# Patient Record
Sex: Female | Born: 1995 | Hispanic: No | Marital: Single | Smoking: Never smoker
Health system: Southern US, Community
[De-identification: ages and names within clinical notes are randomized; demographics above are authoritative.]

## PROBLEM LIST (undated history)

## (undated) HISTORY — PX: TONSILLECTOMY: SUR1361

---
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
## 2017-07-23 ENCOUNTER — Emergency Department (HOSPITAL_COMMUNITY): Payer: PRIVATE HEALTH INSURANCE

## 2017-07-23 ENCOUNTER — Emergency Department (HOSPITAL_COMMUNITY)
Admission: EM | Admit: 2017-07-23 | Discharge: 2017-07-23 | Disposition: A | Discharge status: Home / Self Care | Payer: PRIVATE HEALTH INSURANCE | Attending: Emergency Medicine | Admitting: Emergency Medicine

## 2017-07-23 ENCOUNTER — Encounter (HOSPITAL_COMMUNITY): Payer: Self-pay | Admitting: Emergency Medicine

## 2017-07-23 DIAGNOSIS — W501XXA Accidental kick by another person, initial encounter: Secondary | ICD-10-CM | POA: Insufficient documentation

## 2017-07-23 DIAGNOSIS — Z79899 Other long term (current) drug therapy: Secondary | ICD-10-CM | POA: Insufficient documentation

## 2017-07-23 DIAGNOSIS — Y9366 Activity, soccer: Secondary | ICD-10-CM | POA: Insufficient documentation

## 2017-07-23 DIAGNOSIS — Y999 Unspecified external cause status: Secondary | ICD-10-CM | POA: Insufficient documentation

## 2017-07-23 DIAGNOSIS — S82891A Other fracture of right lower leg, initial encounter for closed fracture: Secondary | ICD-10-CM | POA: Insufficient documentation

## 2017-07-23 DIAGNOSIS — Y929 Unspecified place or not applicable: Secondary | ICD-10-CM | POA: Insufficient documentation

## 2017-07-23 MED ORDER — ACETAMINOPHEN 500 MG PO TABS
1000.0000 mg | ORAL_TABLET | Freq: Once | ORAL | Status: AC
  Administered 2017-07-23: 1000 mg via ORAL
  Filled 2017-07-23: qty 2

## 2017-07-23 NOTE — Discharge Instructions (Signed)
Weight bear as tolerated.  Use crutches as needed.  Ice, tylenol and motrin as needed, elevate regularly.

## 2017-07-23 NOTE — Progress Notes (Signed)
Orthopedic Tech Progress Note Patient Details:  Amanda Keller 16-Dec-1995 [id number] Patient did not want braces stated she has both braces at home. Patient ID: Amanda Keller, female   DOB: 03-Aug-1996, 21 y.o.   MRN: [id number]   Amanda Keller 07/23/2017, 4:26 PM

## 2017-07-23 NOTE — ED Provider Notes (Signed)
[facility] Provider Note   CSN: [id number] Arrival date & time: 07/23/17  1422     History   Chief Complaint Chief Complaint  Patient presents with  . Ankle Pain    HPI Eryca Bucknam is a 21 y.o. female.  Patient presents with right ankle injury. Patient is pulling soccer and caught her right foot on another player. Patient heard sudden pop and had swelling and pain on the lateral ankle. No other injuries. No significant medical history.      History reviewed. No pertinent past medical history.  There are no active problems to display for this patient.   Past Surgical History:  Procedure Laterality Date  . TONSILLECTOMY      OB History    No data available       Home Medications    Prior to Admission medications   Medication Sig Start Date End Date Taking? Authorizing Provider  naproxen (NAPROSYN) 500 MG tablet Take 1 tablet (500 mg total) by mouth 2 (two) times daily. 08/23/16   Dowless, Lester Kinsman, PA-C    Family History No family history on file.  Social History Social History  Substance Use Topics  . Smoking status: Never Smoker  . Smokeless tobacco: Never Used  . Alcohol use Yes     Allergies   Patient has no known allergies.   Review of Systems Review of Systems  Constitutional: Negative for fever.  Musculoskeletal: Positive for joint swelling.  Neurological: Negative for weakness.     Physical Exam Updated Vital Signs BP 110/80 (BP Location: Left Arm)   Pulse 84   Temp 98.3 F (36.8 C) (Oral)   Resp 17   Ht  (1.676 m)   Wt 59 kg (130 lb)   LMP 06/30/2017   SpO2 100%   BMI 20.98 kg/m   Physical Exam  Constitutional: She appears well-developed and well-nourished.  HENT:  Head: Normocephalic and atraumatic.  Pulmonary/Chest: Effort normal.  Musculoskeletal: She exhibits edema and tenderness.  Patient has mild edema and moderate tenderness lateral malleolus. No obvious deformity. No tenderness to proximal  or mid tibia or right knee. Decreased range of motion with extension and lateral/eversion of right ankle. Neurovascularly intact.  Neurological: She is alert.  Skin: Skin is warm.  Psychiatric: She has a normal mood and affect.  Nursing note and vitals reviewed.    ED Treatments / Results  Labs (all labs ordered are listed, but only abnormal results are displayed) Labs Reviewed - No data to display  EKG  EKG Interpretation None       Radiology Dg Ankle Complete Right  Result Date: 07/23/2017 CLINICAL DATA:  Right ankle pain and swelling following a fall. EXAM: RIGHT ANKLE - COMPLETE 3+ VIEW COMPARISON:  08/23/2016. FINDINGS: Diffuse lateral soft tissue swelling. Interval tiny avulsion of the distal aspect of the medial malleolus. No effusion seen. IMPRESSION: Interval tiny avulsion fracture of the distal aspect of the medial malleolus. Electronically Signed   By: Beckie Salts M.D.   On: 07/23/2017 15:48    Procedures Procedures (including critical care time)  Medications Ordered in ED Medications  acetaminophen (TYLENOL) tablet 1,000 mg (not administered)     Initial Impression / Assessment and Plan / ED Course  I have reviewed the triage vital signs and the nursing notes.  Pertinent labs & imaging results that were available during my care of the patient were reviewed by me and considered in my medical decision making (see chart for details).  Patient presents with ankle injury, x-ray revealed small avulsion fracture. ASO applied.  Pain meds. Patient was provided definitive orthopedic care for this avulsion fracture in the ER. Crutches as needed.   Results and differential diagnosis were discussed with the patient/parent/guardian. Xrays were independently reviewed by myself.  Close follow up outpatient was discussed, comfortable with the plan.   Medications  acetaminophen (TYLENOL) tablet 1,000 mg (not administered)    Vitals:   07/23/17 1434 07/23/17 1435    BP: 110/80   Pulse: 84   Resp: 17   Temp: 98.3 F (36.8 C)   TempSrc: Oral   SpO2: 100%   Weight:  59 kg (130 lb)  Height:   (1.676 m)    Final diagnoses:  Closed avulsion fracture of right ankle, initial encounter     Final Clinical Impressions(s) / ED Diagnoses   Final diagnoses:  Closed avulsion fracture of right ankle, initial encounter    New Prescriptions New Prescriptions   No medications on file     Blane Ohara, MD 07/23/17 1625

## 2017-07-23 NOTE — ED Triage Notes (Signed)
Pt. Stated, I was playing soccer and I was running and the other person stopped and I ran into her. I heard something in my ankle pop.

## 2018-04-04 IMAGING — CR DG ANKLE COMPLETE 3+V*R*
3 series · 3 of 3 positions shown · non-contrast
Comparison: 08/23/2016.

CLINICAL DATA: Right ankle pain and swelling following a fall.

EXAM:
RIGHT ANKLE - COMPLETE 3+ VIEW

[ankle ap]
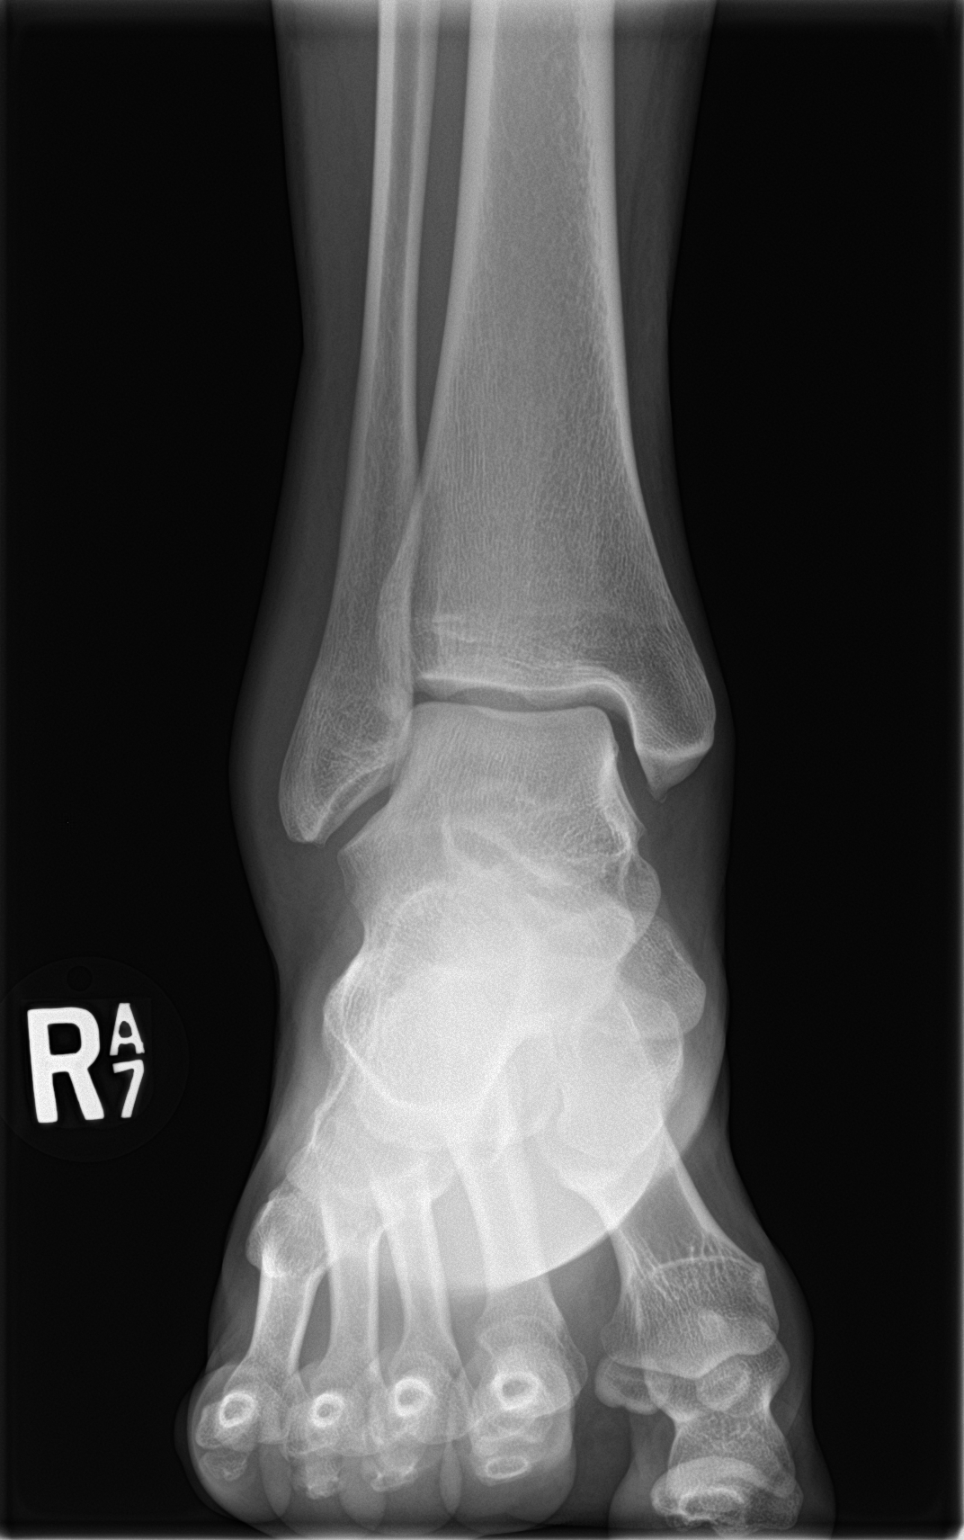

[ankle obl]
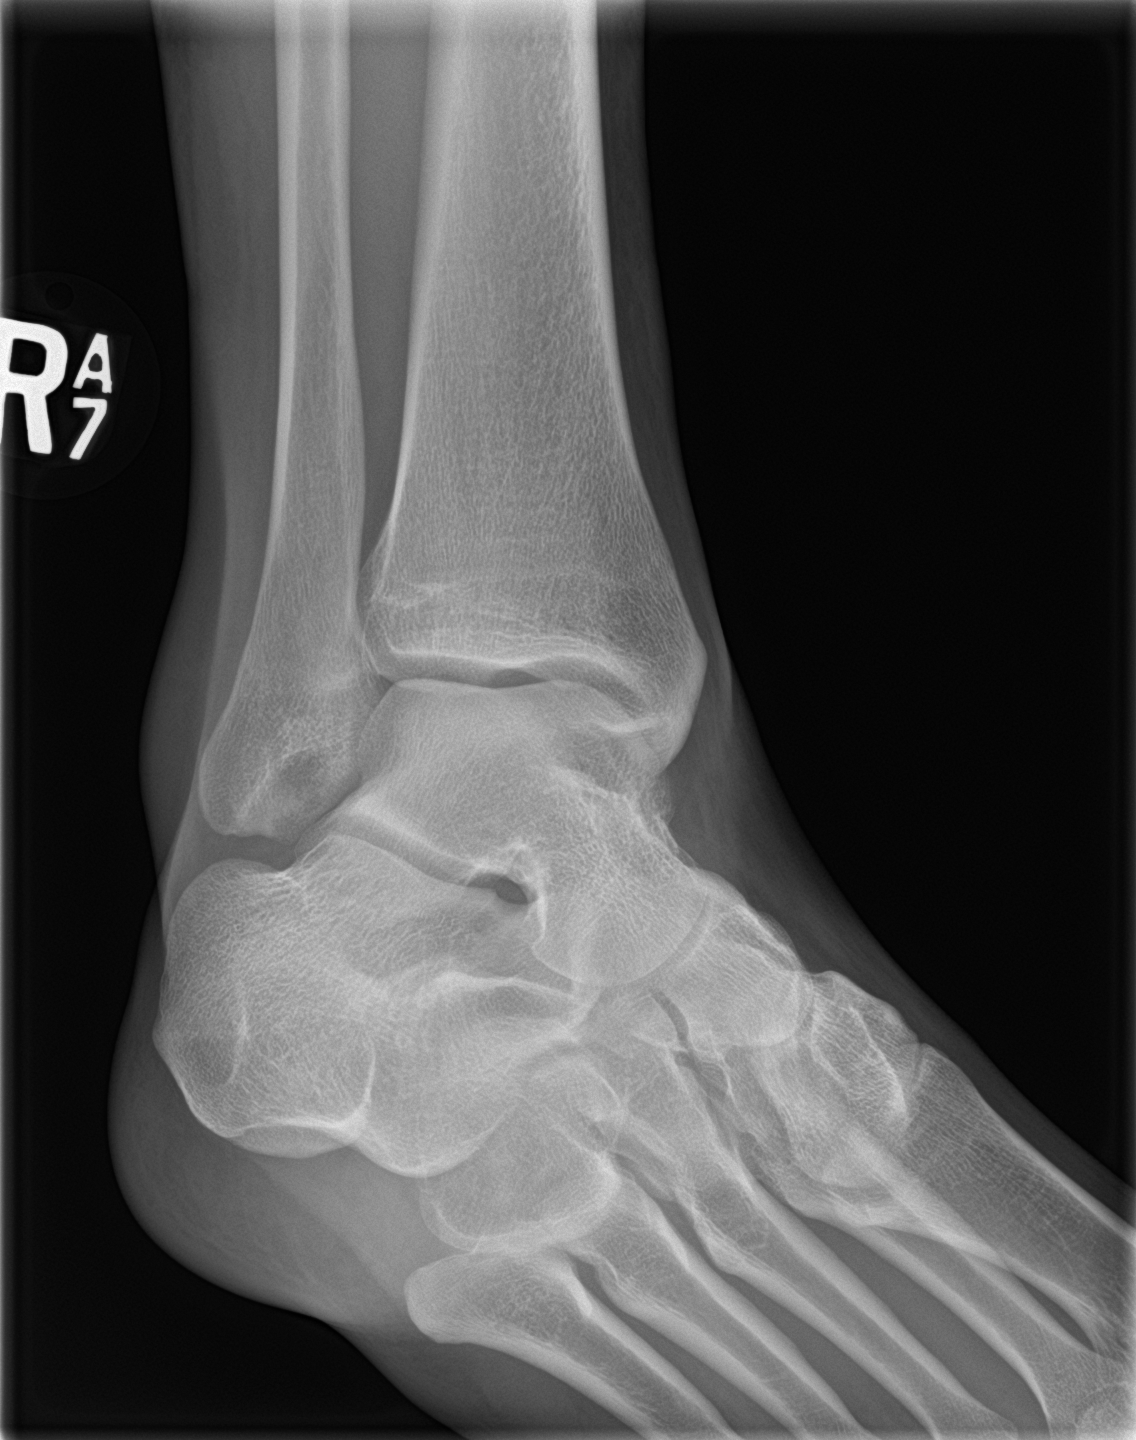

[ankle lat]
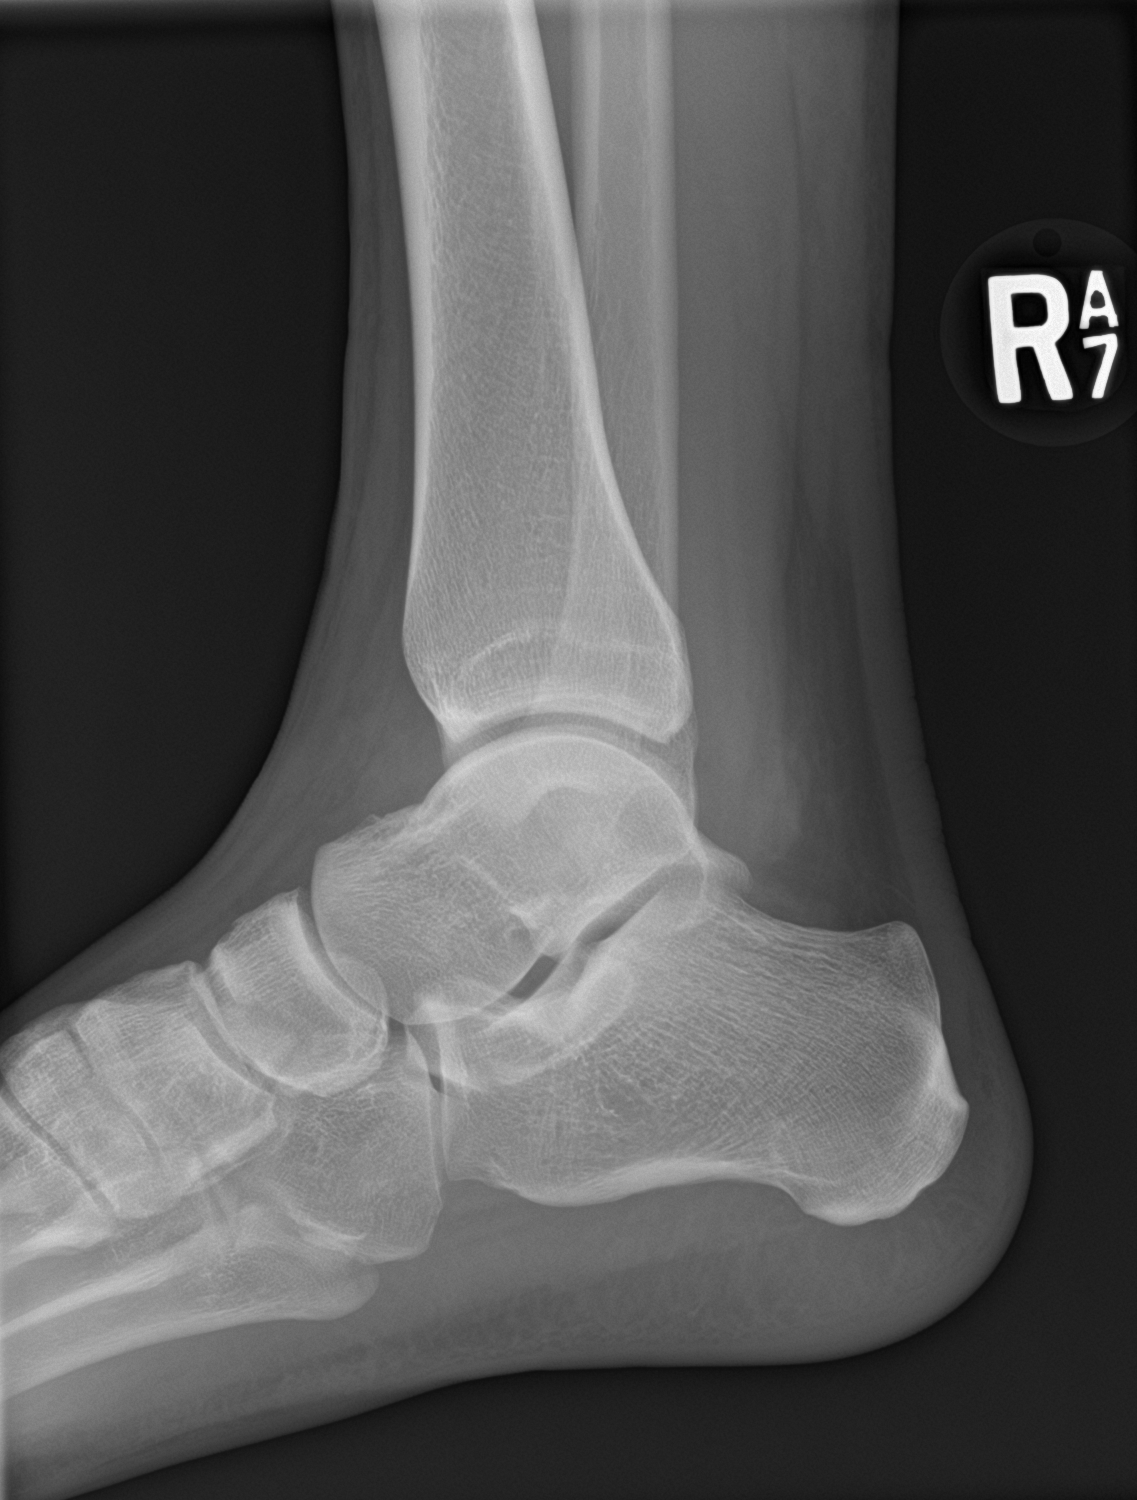

[3 of 3 positions shown; findings below may reference images not displayed]

FINDINGS: Diffuse lateral soft tissue swelling. Interval tiny avulsion of the
distal aspect of the medial malleolus. No effusion seen.
IMPRESSION: Interval tiny avulsion fracture of the distal aspect of the medial
malleolus.
